# Patient Record
Sex: Male | Born: 1960 | Race: White | Hispanic: No | Marital: Single | Smoking: Never smoker
Health system: Southern US, Community
[De-identification: ages and names within clinical notes are randomized; demographics above are authoritative.]

## PROBLEM LIST (undated history)

## (undated) DIAGNOSIS — F419 Anxiety disorder, unspecified: Secondary | ICD-10-CM

## (undated) DIAGNOSIS — F1010 Alcohol abuse, uncomplicated: Secondary | ICD-10-CM

## (undated) DIAGNOSIS — E669 Obesity, unspecified: Secondary | ICD-10-CM

## (undated) HISTORY — DX: Anxiety disorder, unspecified: F41.9

## (undated) HISTORY — DX: Obesity, unspecified: E66.9

## (undated) HISTORY — DX: Alcohol abuse, uncomplicated: F10.10

---

## 2003-04-21 ENCOUNTER — Ambulatory Visit (HOSPITAL_COMMUNITY): Admission: RE | Admit: 2003-04-21 | Discharge: 2003-04-21 | Payer: Self-pay | Admitting: *Deleted

## 2006-10-15 ENCOUNTER — Ambulatory Visit (HOSPITAL_COMMUNITY): Admission: RE | Admit: 2006-10-15 | Discharge: 2006-10-15 | Payer: Self-pay | Admitting: Family Medicine

## 2009-08-17 ENCOUNTER — Encounter: Admission: RE | Admit: 2009-08-17 | Discharge: 2009-08-17 | Payer: Self-pay | Admitting: Family Medicine

## 2009-10-06 ENCOUNTER — Ambulatory Visit (HOSPITAL_COMMUNITY): Admission: RE | Admit: 2009-10-06 | Discharge: 2009-10-06 | Payer: Self-pay | Admitting: Family Medicine

## 2012-01-16 ENCOUNTER — Inpatient Hospital Stay: Payer: Self-pay | Admitting: Psychiatry

## 2012-01-16 LAB — URINALYSIS, COMPLETE
Bacteria: NONE SEEN
Bilirubin,UR: NEGATIVE
Ph: 5 (ref 4.5–8.0)
Protein: NEGATIVE
Squamous Epithelial: 1
WBC UR: 5 /HPF (ref 0–5)

## 2012-01-16 LAB — CBC
MCHC: 34.3 g/dL (ref 32.0–36.0)
Platelet: 283 10*3/uL (ref 150–440)

## 2012-01-16 LAB — DRUG SCREEN, URINE
Barbiturates, Ur Screen: NEGATIVE (ref ?–200)
Cannabinoid 50 Ng, Ur ~~LOC~~: NEGATIVE (ref ?–50)
Cocaine Metabolite,Ur ~~LOC~~: NEGATIVE (ref ?–300)
Methadone, Ur Screen: NEGATIVE (ref ?–300)
Opiate, Ur Screen: NEGATIVE (ref ?–300)
Phencyclidine (PCP) Ur S: NEGATIVE (ref ?–25)
Tricyclic, Ur Screen: NEGATIVE (ref ?–1000)

## 2012-01-16 LAB — COMPREHENSIVE METABOLIC PANEL
Albumin: 3.9 g/dL (ref 3.4–5.0)
BUN: 10 mg/dL (ref 7–18)
Calcium, Total: 9.1 mg/dL (ref 8.5–10.1)
Chloride: 99 mmol/L (ref 98–107)
Creatinine: 1.12 mg/dL (ref 0.60–1.30)
EGFR (African American): >60
Osmolality: 266 (ref 275–301)
Potassium: 4.2 mmol/L (ref 3.5–5.1)
SGPT (ALT): 47 U/L (ref 12–78)

## 2012-01-16 LAB — TSH: Thyroid Stimulating Horm: 3.35 u[IU]/mL

## 2012-01-16 LAB — ETHANOL: Ethanol: <3 mg/dL

## 2012-06-11 ENCOUNTER — Emergency Department: Payer: Self-pay

## 2012-06-11 LAB — CBC WITH DIFFERENTIAL/PLATELET
Basophil %: 0.7 %
Eosinophil #: 0.3 10*3/uL (ref 0.0–0.7)
Eosinophil %: 3.4 %
Lymphocyte %: 20.8 %
MCV: 89 fL (ref 80–100)
Monocyte #: 0.6 x10 3/mm (ref 0.2–1.0)
Monocyte %: 7.1 %
Neutrophil %: 68 %
WBC: 8.2 10*3/uL (ref 3.8–10.6)

## 2012-06-11 LAB — BASIC METABOLIC PANEL
BUN: 13 mg/dL (ref 7–18)
Calcium, Total: 9.4 mg/dL (ref 8.5–10.1)
Co2: 27 mmol/L (ref 21–32)
EGFR (African American): >60
EGFR (Non-African Amer.): >60
Potassium: 3.7 mmol/L (ref 3.5–5.1)

## 2012-06-17 LAB — CULTURE, BLOOD (SINGLE)

## 2013-02-13 ENCOUNTER — Emergency Department: Payer: Self-pay | Admitting: Emergency Medicine

## 2013-02-13 LAB — CBC WITH DIFFERENTIAL/PLATELET
BASOS ABS: 0.1 10*3/uL (ref 0.0–0.1)
Basophil %: 1.2 %
EOS ABS: 0.2 10*3/uL (ref 0.0–0.7)
Eosinophil %: 2.6 %
HCT: 42.4 % (ref 40.0–52.0)
HGB: 14 g/dL (ref 13.0–18.0)
LYMPHS PCT: 24 %
Lymphocyte #: 1.5 10*3/uL (ref 1.0–3.6)
MCH: 30.3 pg (ref 26.0–34.0)
MCHC: 32.9 g/dL (ref 32.0–36.0)
MCV: 92 fL (ref 80–100)
Monocyte #: 0.5 x10 3/mm (ref 0.2–1.0)
Monocyte %: 8.2 %
NEUTROS ABS: 4 10*3/uL (ref 1.4–6.5)
Neutrophil %: 64 %
PLATELETS: 258 10*3/uL (ref 150–440)
RBC: 4.6 10*6/uL (ref 4.40–5.90)
RDW: 13.4 % (ref 11.5–14.5)
WBC: 6.3 10*3/uL (ref 3.8–10.6)

## 2013-02-13 LAB — COMPREHENSIVE METABOLIC PANEL
ALBUMIN: 3.5 g/dL (ref 3.4–5.0)
ALK PHOS: 89 U/L
ALT: 29 U/L (ref 12–78)
ANION GAP: 1 — AB (ref 7–16)
AST: 26 U/L (ref 15–37)
BILIRUBIN TOTAL: 0.4 mg/dL (ref 0.2–1.0)
BUN: 8 mg/dL (ref 7–18)
CALCIUM: 8.8 mg/dL (ref 8.5–10.1)
CHLORIDE: 105 mmol/L (ref 98–107)
CREATININE: 1.05 mg/dL (ref 0.60–1.30)
Co2: 31 mmol/L (ref 21–32)
EGFR (African American): >60
EGFR (Non-African Amer.): >60
Glucose: 88 mg/dL (ref 65–99)
OSMOLALITY: 272 (ref 275–301)
Potassium: 4.1 mmol/L (ref 3.5–5.1)
Sodium: 137 mmol/L (ref 136–145)
TOTAL PROTEIN: 6.9 g/dL (ref 6.4–8.2)

## 2013-02-13 LAB — PRO B NATRIURETIC PEPTIDE: B-Type Natriuretic Peptide: 22 pg/mL (ref 0–125)

## 2013-02-13 LAB — TROPONIN I: Troponin-I: <0.02 ng/mL

## 2013-11-29 ENCOUNTER — Encounter: Payer: Self-pay | Admitting: *Deleted

## 2013-12-14 ENCOUNTER — Encounter: Payer: Self-pay | Admitting: Specialist

## 2014-01-01 ENCOUNTER — Encounter: Payer: Self-pay | Admitting: Specialist

## 2014-03-24 ENCOUNTER — Emergency Department: Payer: Self-pay | Admitting: Emergency Medicine

## 2014-04-08 ENCOUNTER — Emergency Department
Admit: 2014-04-08 | Disposition: A | Discharge status: Home / Self Care | Payer: Self-pay | Admitting: Emergency Medicine

## 2014-04-23 NOTE — H&P (Signed)
PATIENT NAME:  Nicholas Larsen, Nicholas Larsen MR#:  [id number] DATE OF BIRTH:  19-Oct-1960  DATE OF ADMISSION:  01/16/2012  IDENTIFYING INFORMATION: The patient is a 54 year old man who came to the Emergency Room voluntarily seeking detox from alcohol.   CHIEF COMPLAINT: "I'm an alcoholic."   HISTORY OF PRESENT ILLNESS: Information is obtained from the patient and the chart. He says that he has been drinking steadily for about the last 3 to 4 years. He claims that he drinks about 8 or 12 beers a day. He is motivated to stop drinking because his sisters have been holding it over him and suggesting that they would refuse him money from the family estate because of his drinking. Also, he lost his driver's license a couple of years ago and has not been able to work. He says that his mood has been somewhat anxious. At another point, he describes it as depressed. He denies suicidal ideation. He denies homicidal ideation. He denies hallucinations. He sleeps poorly, feels tired a lot during the day. He feels bad about himself, does not have very much that he enjoys in his life. He says that he does use clonazepam 3 mg a day, which is prescribed to him by his primary care doctor. He denies that he abuses it or abuses any other drugs.   PAST PSYCHIATRIC HISTORY: Only previous psychiatric admissions were for detox from alcohol. He denies history of suicide attempts. He denies any history of psychosis. He says he is not on any psychiatric medicine and has not been in the past.   SUBSTANCE ABUSE HISTORY: He says that he had managed to stay sober for about 20 years between 1990 and 2010 but then relapsed and has been drinking steadily ever since. He is familiar with 12-step programs and substance abuse treatment programs. He denies any history of seizures or delirium tremens. He denies abuse of other drugs.   SOCIAL HISTORY: Not married, no children. He lives by himself. 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 Closest relatives are his 2 sisters who, from what I can tell, basically hold the pursestrings over him, especially since unemployment has run out.   FAMILY HISTORY: None identified.   REVIEW OF SYSTEMS: He complains of feeling tired, run down, bad about himself and depressed. Denies suicidal ideation. Denies psychotic symptoms. Denies nausea and vomiting or any GI symptoms. He says he has chronic pain in his knees from knee surgery.   PAST MEDICAL HISTORY: Has had some knee surgery in the past. Otherwise, has no known ongoing medical problems.   CURRENT MEDICATIONS: Listed as only being clonazepam 1 mg 3 times a day,   ALLERGIES: No known drug allergies.   MENTAL STATUS EXAM: Overweight gentleman interviewed in the Emergency Room. Eye contact was good. Psychomotor activity normal. Cooperative with the exam. Speech normal in rate, tone, and volume. Affect somewhat flat and blunted. Mood stated as being depressed. Thoughts are generally lucid, a little bit slowed. Denies any hallucinations. No evidence of delusions or paranoia. Denies suicidal or homicidal ideation. Judgment and insight are reasonably intact. Short and long-term memory appeared grossly intact. Alert and oriented x 4.   PHYSICAL EXAMINATION: GENERAL: Obese gentleman, weight 340 pounds. He does not look to be in any acute distress.  SKIN: No skin lesions identified.  HEENT: Pupils are equal and reactive. Face symmetric. Oral mucosa normal. Nontender neck and back.  MUSCULOSKELETAL: Full range of motion at  all extremities. Gait is a little bit wide-based and slow. Strength is symmetric, as are reflexes throughout.  NEUROLOGICAL: Cranial nerves symmetric and normal.  LUNGS: Clear with no wheezes.  HEART: Regular rate and rhythm.  ABDOMEN: Obese, soft, nontender, normal bowel sounds.  VITAL SIGNS: Temperature last read as 98.8, pulse 93, respirations 20, blood pressure 174/93.   LABORATORY  RESULTS: Drug screen positive for benzodiazepines. TSH normal at 3.3. Alcohol undetected. Bilirubin elevated at 1.1. Sodium slightly low at 133. No other significant laboratory abnormalities. CBC normal.   ASSESSMENT: A 54 year old man with alcohol dependence, seeks detox. No history of seizures or DTs. Has not been able to quit drinking on his own. Gets very shaky when he does. Mood is a little bit run down. Feeling somewhat hopeless about himself, limited social resources.   TREATMENT PLAN: Admit to psychiatry. Regular detox protocol orders. Engage in daily groups and activities for psychoeducation and supportive therapy about substance abuse. Work with him on encouraging possible referral [facility].   DIAGNOSIS, PRINCIPAL AND PRIMARY:  AXIS I: Alcohol dependence.   SECONDARY DIAGNOSES: AXIS I: Depression, not otherwise specified.   AXIS II: Deferred.   AXIS III:  1. Obesity. 2. Chronic knee pain.   AXIS IV:   Moderate, ongoing stress from lack of resources.   AXIS V: Functioning at time of evaluation 35.   ____________________________ Audery Amel, MD jtc:cb D: 01/16/2012 18:06:04 ET T: 01/16/2012 18:16:24 ET JOB#: [id number]  cc: Audery Amel, MD, <Dictator> Audery Amel MD ELECTRONICALLY SIGNED 01/16/2012 23:26

## 2014-04-23 NOTE — Consult Note (Signed)
Brief Consult Note: Diagnosis: alcohol dep.   Patient was seen by consultant.   Consult note dictated.   Recommend further assessment or treatment.   Orders entered.   Discussed with Attending MD.   Comments: Psychiatry: Patient seen. Admit for alcohol detox.  Electronic Signatures: Audery Amellapacs, John T (MD)  (Signed 15-Jan-14 17:58)  Authored: Brief Consult Note   Last Updated: 15-Jan-14 17:58 by Audery Amellapacs, John T (MD)

## 2014-04-23 NOTE — Discharge Summary (Signed)
PATIENT NAME:  Nicholas Larsen, Momin J MR#:  [id number] DATE OF BIRTH:  [birth year]  DATE OF ADMISSION:  01/16/2012  DATE OF DISCHARGE:  01/23/2012  HOSPITAL COURSE: See dictated history and physical for details of admission. This 54 year old gentleman with a history of alcohol dependence was admitted through the Emergency Room with complaints of depression, low mood, passive suicidal ideation related to relapse into substance abuse. He was admitted to the hospital for alcohol withdrawal treatment. The patient was very focused on wanting to go to longer-term rehab treatment as well. During his hospital stay, he was tapered off of benzodiazepines without difficulty. He attended groups and participated appropriately. He showed good judgment and insight. Did not report any suicidal ideation and was positively focused on wanting to stay sober. A bed was obtained at the [facility], and he was discharged for transfer voluntarily to that facility on the 22nd. Medically, he was stable. He was not requiring any psychiatric medication at discharge.   DISCHARGE MEDICATIONS: Claritin 10 mg p.o. daily, hydrochlorothiazide 25 mg p.o. daily.   LABORATORY RESULTS: Admission lab studies on January 15, showed a drug screen positive for benzodiazepines. TSH normal at 3.35. Alcohol nondetected. Chemistry panel with a slightly low sodium at 133, glucose slightly elevated at 115, bilirubin 1.1. Hematology panel normal. Urinalysis unremarkable.   MENTAL STATUS EXAM AT DISCHARGE: Neatly dressed and groomed man who looks his stated age. Cooperative with the interview. Good eye contact, normal psychomotor activity. Speech normal in rate, tone and volume. Affect euthymic, reactive, appropriate. Mood stated as good. Thoughts are lucid without loosening of associations or delusions. Denies auditory or visual hallucinations. Denies suicidal or homicidal ideation. Shows good insight and judgment. Normal  intelligence. Appropriate short and long term memory intact.   DISPOSITION: Discharge voluntarily for transfer to the alcohol and drug abuse treatment center.   DIAGNOSIS PRINCIPLE AND PRIMARY:  AXIS I: Alcohol dependence.   SECONDARY DIAGNOSES: AXIS I: Substance-induced mood disorder.  AXIS II: Deferred.  AXIS III: Hypertension, seasonal allergies.  AXIS IV: Moderate stress from financial strain.  AXIS V: Functioning at time of discharge 60.   ____________________________ Audery AmelJohn T. Kinser Fellman, MD jtc:dm D: 02/03/2012 18:38:23 ET T: 02/04/2012 09:31:29 ET JOB#: [id number]  cc: Audery AmelJohn T. Hien Cunliffe, MD, <Dictator> Audery AmelJOHN T Saphyre Cillo MD ELECTRONICALLY SIGNED 02/04/2012 18:50

## 2014-06-04 ENCOUNTER — Other Ambulatory Visit (HOSPITAL_COMMUNITY): Payer: Self-pay | Admitting: *Deleted

## 2014-06-04 ENCOUNTER — Ambulatory Visit: Payer: Self-pay | Admitting: Physical Therapy

## 2014-06-04 ENCOUNTER — Ambulatory Visit (HOSPITAL_COMMUNITY): Payer: Medicaid Other | Attending: Internal Medicine

## 2014-06-04 DIAGNOSIS — M79605 Pain in left leg: Secondary | ICD-10-CM

## 2014-06-08 ENCOUNTER — Emergency Department
Admission: EM | Admit: 2014-06-08 | Discharge: 2014-06-08 | Disposition: A | Discharge status: Home / Self Care | Payer: No Typology Code available for payment source | Attending: Emergency Medicine | Admitting: Emergency Medicine

## 2014-06-08 ENCOUNTER — Encounter: Payer: Self-pay | Admitting: Emergency Medicine

## 2014-06-08 ENCOUNTER — Emergency Department: Payer: No Typology Code available for payment source

## 2014-06-08 DIAGNOSIS — S161XXA Strain of muscle, fascia and tendon at neck level, initial encounter: Secondary | ICD-10-CM | POA: Diagnosis not present

## 2014-06-08 DIAGNOSIS — S8001XA Contusion of right knee, initial encounter: Secondary | ICD-10-CM | POA: Insufficient documentation

## 2014-06-08 DIAGNOSIS — Z79899 Other long term (current) drug therapy: Secondary | ICD-10-CM | POA: Diagnosis not present

## 2014-06-08 DIAGNOSIS — Y92410 Unspecified street and highway as the place of occurrence of the external cause: Secondary | ICD-10-CM | POA: Insufficient documentation

## 2014-06-08 DIAGNOSIS — Y998 Other external cause status: Secondary | ICD-10-CM | POA: Diagnosis not present

## 2014-06-08 DIAGNOSIS — Y9389 Activity, other specified: Secondary | ICD-10-CM | POA: Insufficient documentation

## 2014-06-08 DIAGNOSIS — S199XXA Unspecified injury of neck, initial encounter: Secondary | ICD-10-CM | POA: Diagnosis present

## 2014-06-08 MED ORDER — KETOROLAC TROMETHAMINE 10 MG PO TABS: 10.0000 mg | ORAL_TABLET | Freq: Three times a day (TID) | ORAL | Status: AC

## 2014-06-08 MED ORDER — ORPHENADRINE CITRATE 30 MG/ML IJ SOLN
60.0000 mg | INTRAMUSCULAR | Status: AC
  Administered 2014-06-08: 60 mg via INTRAMUSCULAR

## 2014-06-08 MED ORDER — KETOROLAC TROMETHAMINE 60 MG/2ML IM SOLN
INTRAMUSCULAR | Status: AC
  Filled 2014-06-08: qty 2

## 2014-06-08 MED ORDER — KETOROLAC TROMETHAMINE 60 MG/2ML IM SOLN
60.0000 mg | Freq: Once | INTRAMUSCULAR | Status: AC
  Administered 2014-06-08: 60 mg via INTRAMUSCULAR

## 2014-06-08 MED ORDER — ORPHENADRINE CITRATE ER 100 MG PO TB12: 100.0000 mg | ORAL_TABLET | Freq: Two times a day (BID) | ORAL | Status: AC

## 2014-06-08 MED ORDER — ORPHENADRINE CITRATE 30 MG/ML IJ SOLN
INTRAMUSCULAR | Status: AC
  Filled 2014-06-08: qty 2
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
## 2014-08-09 LAB — BASIC METABOLIC PANEL
ANION GAP: 8 (ref 7–16)
BUN: 12 mg/dL
CALCIUM: 9.4 mg/dL
CHLORIDE: 99 mmol/L — AB
CO2: 30 mmol/L
CREATININE: 1.13 mg/dL
EGFR (African American): >60
Glucose: 102 mg/dL — ABNORMAL HIGH
Potassium: 4 mmol/L
Sodium: 137 mmol/L

## 2014-08-09 LAB — CBC
HCT: 43.8 % (ref 40.0–52.0)
HGB: 14.1 g/dL (ref 13.0–18.0)
MCH: 29.2 pg (ref 26.0–34.0)
MCHC: 32.1 g/dL (ref 32.0–36.0)
MCV: 91 fL (ref 80–100)
PLATELETS: 310 10*3/uL (ref 150–440)
RBC: 4.82 10*6/uL (ref 4.40–5.90)
RDW: 13.3 % (ref 11.5–14.5)
WBC: 9.5 10*3/uL (ref 3.8–10.6)

## 2014-08-09 LAB — URIC ACID: URIC ACID: 8.6 mg/dL — AB

## 2014-09-24 ENCOUNTER — Emergency Department
Admission: EM | Admit: 2014-09-24 | Discharge: 2014-09-24 | Disposition: A | Discharge status: Home / Self Care | Payer: Medicaid Other | Attending: Emergency Medicine | Admitting: Emergency Medicine

## 2014-09-24 ENCOUNTER — Encounter: Payer: Self-pay | Admitting: Emergency Medicine

## 2014-09-24 ENCOUNTER — Emergency Department: Payer: Medicaid Other

## 2014-09-24 DIAGNOSIS — M25519 Pain in unspecified shoulder: Secondary | ICD-10-CM | POA: Insufficient documentation

## 2014-09-24 DIAGNOSIS — E669 Obesity, unspecified: Secondary | ICD-10-CM | POA: Diagnosis not present

## 2014-09-24 DIAGNOSIS — M7918 Myalgia, other site: Secondary | ICD-10-CM

## 2014-09-24 DIAGNOSIS — Y998 Other external cause status: Secondary | ICD-10-CM | POA: Diagnosis not present

## 2014-09-24 DIAGNOSIS — M25569 Pain in unspecified knee: Secondary | ICD-10-CM | POA: Insufficient documentation

## 2014-09-24 DIAGNOSIS — Z791 Long term (current) use of non-steroidal anti-inflammatories (NSAID): Secondary | ICD-10-CM | POA: Insufficient documentation

## 2014-09-24 DIAGNOSIS — Z79899 Other long term (current) drug therapy: Secondary | ICD-10-CM | POA: Insufficient documentation

## 2014-09-24 DIAGNOSIS — T148 Other injury of unspecified body region: Secondary | ICD-10-CM | POA: Insufficient documentation

## 2014-09-24 DIAGNOSIS — W010XXA Fall on same level from slipping, tripping and stumbling without subsequent striking against object, initial encounter: Secondary | ICD-10-CM | POA: Insufficient documentation

## 2014-09-24 DIAGNOSIS — Y9259 Other trade areas as the place of occurrence of the external cause: Secondary | ICD-10-CM | POA: Insufficient documentation

## 2014-09-24 DIAGNOSIS — R0602 Shortness of breath: Secondary | ICD-10-CM | POA: Diagnosis not present

## 2014-09-24 DIAGNOSIS — G8929 Other chronic pain: Secondary | ICD-10-CM | POA: Insufficient documentation

## 2014-09-24 DIAGNOSIS — Y9389 Activity, other specified: Secondary | ICD-10-CM | POA: Insufficient documentation

## 2014-09-24 DIAGNOSIS — S299XXA Unspecified injury of thorax, initial encounter: Secondary | ICD-10-CM | POA: Diagnosis present

## 2014-09-24 MED ORDER — MELOXICAM 15 MG PO TABS: 15.0000 mg | ORAL_TABLET | Freq: Every day | ORAL | Status: AC

## 2014-09-24 NOTE — ED Provider Notes (Signed)
[facility] Emergency Department Provider Note ____________________________________________  Time seen: Approximately 10:58 AM  I have reviewed the triage vital signs and the nursing notes.   HISTORY  Chief Complaint Fall    HPI Nicholas Larsen is a 54 y.o. male presenting with right and left side pain following a fall one week ago while at a resort in [place]. Patient states that he slipped on a marble floor and landed on his right side. Then when he tried to stand up, he slipped again and does not know which side he landed on. He did not hit his head and did not have LOC. His right side became bruised and swollen following the fall and has continued to be painful. Pain is located on both the right and left side by the ribs just below the nipple line. Pain is described as 8/10 sharp pain, worsened with deep breaths and certain arm movements. Patient states he becomes SOB with the pain. Patient had Flexeril with him on his trip, which did not relieve the pain. The swelling and bruising has since resolved, but the pain remains. Patient denies fever, chills, nausea, vomiting, diarrhea, and constipation. Patient has history of chronic joint pain including to shoulders and knees. He has an appointment with orthopedics on Monday.    Past Medical History  Diagnosis Date  . Obesity   . Anxiety   . Alcohol abuse     There are no active problems to display for this patient.   History reviewed. No pertinent past surgical history.  Current Outpatient Rx  Name  Route  Sig  Dispense  Refill  . clonazePAM (KLONOPIN) 1 MG tablet   Oral   Take 2 mg by mouth 2 (two) times daily.          . cyclobenzaprine (FLEXERIL) 10 MG tablet   Oral   Take 10 mg by mouth 3 (three) times daily as needed for muscle spasms.         . furosemide (LASIX) 40 MG tablet   Oral   Take 40 mg by mouth.         Marland Kitchen ketorolac (TORADOL) 10 MG tablet   Oral   Take 1 tablet (10 mg total) by  mouth every 8 (eight) hours.   15 tablet   0   . meloxicam (MOBIC) 15 MG tablet   Oral   Take 1 tablet (15 mg total) by mouth daily.   30 tablet   2   . metoprolol succinate (TOPROL-XL) 50 MG 24 hr tablet   Oral   Take 50 mg by mouth daily. Take with or immediately following a meal.         . orphenadrine (NORFLEX) 100 MG tablet   Oral   Take 1 tablet (100 mg total) by mouth 2 (two) times daily.   20 tablet   0   . potassium chloride (K-DUR,KLOR-CON) 10 MEQ tablet   Oral   Take 10 mEq by mouth 2 (two) times daily.         . traMADol (ULTRAM) 50 MG tablet   Oral   Take 100 mg by mouth every 6 (six) hours as needed.           Allergies Review of patient's allergies indicates no known allergies.  Family History  Problem Relation Age of Onset  . Family history unknown: Yes    Social History Social History  Substance Use Topics  . Smoking status: Never Smoker   .  Smokeless tobacco: None  . Alcohol Use: Yes    Review of Systems Constitutional: No fever/chills Eyes: No visual changes. ENT: No sore throat. Cardiovascular: Denies chest pain. Respiratory: Positive for shortness of breath, especially with deep breaths. Gastrointestinal: No abdominal pain.  No nausea, no vomiting.  No diarrhea.  No constipation. Genitourinary: Negative for dysuria. Musculoskeletal: Positive for chronic back pain and knee pain. Skin: Negative for rash. Neurological: Negative for headaches, focal weakness or numbness. Hematological/Lymphatic:Positive for resolved bruising on the right side at rib level following fall  Allergic/Immunilogical: no known allergies 10-point ROS otherwise negative.  ____________________________________________   PHYSICAL EXAM:  VITAL SIGNS: ED Triage Vitals  Enc Vitals Group     BP 09/24/14 0952 137/68 mmHg     Pulse Rate 09/24/14 0952 95     Resp 09/24/14 0952 20     Temp 09/24/14 0952 97.6 F (36.4 C)     Temp Source 09/24/14 0952 Oral      SpO2 09/24/14 0952 98 %     Weight 09/24/14 0952 340 lb (154.223 kg)     Height 09/24/14 0952  (1.88 m)     Head Cir --      Peak Flow --      Pain Score 09/24/14 0953 9     Pain Loc --      Pain Edu? --      Excl. in GC? --     Constitutional: Alert and oriented. Well appearing and in no acute distress. Eyes: Conjunctivae are normal. PERRL. EOMI. Head: Atraumatic. Nose: No congestion/rhinnorhea. Mouth/Throat: Mucous membranes are moist.  Oropharynx non-erythematous. Neck: No stridor.   Hematological/Lymphatic/Immunilogical: No cervical lymphadenopathy.  Cardiovascular: Normal rate, regular rhythm. Grossly normal heart sounds.  Good peripheral circulation. Respiratory: Normal respiratory effort.  No retractions. Lungs CTAB. Patient winces with deep breaths. Gastrointestinal: Obese. Soft and nontender. No distention. No abdominal bruits. No CVA tenderness. Musculoskeletal: Point tenderness to right and left side at about level of rib 6-7. No bruising or erythema noted. Full ROM and 5/5 strength in upper extremities. Pain in sides worsened with shoulder adduction.  Neurologic:  Normal speech and language. No gross focal neurologic deficits are appreciated. No gait instability. Skin:  Skin is warm, dry and intact. No rash noted. No bruising or erythema.  Psychiatric: Mood and affect are normal. Speech and behavior are normal.  ____________________________________________   LABS (all labs ordered are listed, but only abnormal results are displayed)  Labs Reviewed - No data to display ____________________________________________  EKG  None ____________________________________________  RADIOLOGY DG Chest 2 View FINDINGS: The lungs are clear. Heart size is upper normal. No pneumothorax or pleural effusion. No bony abnormality is identified. IMPRESSION: No acute abnormality. ____________________________________________   PROCEDURES  Procedure(s) performed:  None  Critical Care performed: No  ____________________________________________   INITIAL IMPRESSION / ASSESSMENT AND PLAN / ED COURSE  Pertinent labs & imaging results that were available during my care of the patient were reviewed by me and considered in my medical decision making (see chart for details).  Patient was advised to keep the appointment with orthopedics and advised them of his right side pain. He was advised to return to the emergency department for symptoms that change or worsen if he is unable to get an earlier appointment. ____________________________________________   FINAL CLINICAL IMPRESSION(S) / ED DIAGNOSES  Final diagnoses:  Musculoskeletal pain     Chinita Pester, FNP 09/24/14 1339  Darien Ramus, MD 09/24/14 1544

## 2014-09-24 NOTE — Discharge Instructions (Signed)

## 2014-09-24 NOTE — ED Notes (Signed)
Reports fell a week ago on a cruise, pain in right ribs since.  No resp distress

## 2015-08-08 ENCOUNTER — Emergency Department
Admission: EM | Admit: 2015-08-08 | Discharge: 2015-08-08 | Disposition: A | Discharge status: Home / Self Care | Payer: Medicare HMO | Attending: Emergency Medicine | Admitting: Emergency Medicine

## 2015-08-08 ENCOUNTER — Encounter: Payer: Self-pay | Admitting: Emergency Medicine

## 2015-08-08 ENCOUNTER — Emergency Department: Payer: Medicare HMO

## 2015-08-08 DIAGNOSIS — S42401A Unspecified fracture of lower end of right humerus, initial encounter for closed fracture: Secondary | ICD-10-CM | POA: Diagnosis not present

## 2015-08-08 DIAGNOSIS — S0990XA Unspecified injury of head, initial encounter: Secondary | ICD-10-CM

## 2015-08-08 DIAGNOSIS — W108XXA Fall (on) (from) other stairs and steps, initial encounter: Secondary | ICD-10-CM | POA: Insufficient documentation

## 2015-08-08 DIAGNOSIS — Y92410 Unspecified street and highway as the place of occurrence of the external cause: Secondary | ICD-10-CM | POA: Insufficient documentation

## 2015-08-08 DIAGNOSIS — S022XXA Fracture of nasal bones, initial encounter for closed fracture: Secondary | ICD-10-CM | POA: Diagnosis not present

## 2015-08-08 DIAGNOSIS — S59901A Unspecified injury of right elbow, initial encounter: Secondary | ICD-10-CM | POA: Diagnosis present

## 2015-08-08 DIAGNOSIS — Y939 Activity, unspecified: Secondary | ICD-10-CM | POA: Diagnosis not present

## 2015-08-08 DIAGNOSIS — Y999 Unspecified external cause status: Secondary | ICD-10-CM | POA: Insufficient documentation

## 2015-08-08 DIAGNOSIS — T1490XA Injury, unspecified, initial encounter: Secondary | ICD-10-CM

## 2015-08-08 DIAGNOSIS — S022XXB Fracture of nasal bones, initial encounter for open fracture: Secondary | ICD-10-CM

## 2015-08-08 MED ORDER — HYDROMORPHONE HCL 1 MG/ML IJ SOLN
1.0000 mg | Freq: Once | INTRAMUSCULAR | Status: AC
  Administered 2015-08-08: 1 mg via INTRAVENOUS
  Filled 2015-08-08: qty 1

## 2015-08-08 MED ORDER — ONDANSETRON HCL 4 MG/2ML IJ SOLN
4.0000 mg | Freq: Once | INTRAMUSCULAR | Status: AC
Start: 2015-08-08 — End: 2015-08-08
  Administered 2015-08-08: 4 mg via INTRAVENOUS
  Filled 2015-08-08: qty 2

## 2015-08-08 MED ORDER — CEPHALEXIN 500 MG PO CAPS: 500.0000 mg | ORAL_CAPSULE | Freq: Four times a day (QID) | ORAL | 0 refills | Status: AC

## 2015-08-08 MED ORDER — TRAMADOL HCL ER 100 MG PO TB24: 100.0000 mg | ORAL_TABLET | Freq: Every day | ORAL | 0 refills | Status: AC

## 2015-08-08 NOTE — Discharge Instructions (Signed)
Please apply ice to the areas of swelling including the nose and the elbow region. The splint is temporary and no need to follow up with [place]. Please call them later this morning to establish outpatient follow-up. Please call the ENT physician concerning your nasal fracture. The most likely will see you after the swelling is decreased. Please return immediately if condition worsens. Please contact her primary physician or the physician you were given for referral. If you have any specialist physicians involved in her treatment and plan please also contact them. Thank you for using  regional emergency Department.

## 2015-08-08 NOTE — ED Notes (Signed)
Cleaned pt's nose with sterile saline, and applied SteriStrips per MD Huel CoteQuigley order. Techs to bedside to apply splint/sling

## 2015-08-08 NOTE — ED Notes (Signed)
Reviewed d/c instructions, follow-up care, prescriptions, use of ice/elevation with pt and pt's spouse. PT and spouse verbalized [place]

## 2015-08-08 NOTE — ED Triage Notes (Signed)
Pt arrived by EMS from home, post fall. EMS reports pt had consumed several alcoholic beverages, went outside, fell face first onto wooden steps. Upon arrival pt has deformity to right arm and bloody swollen nose. Pt is A&O x4 with slurred speech, poor safety awareness. Fall risk applied.

## 2015-08-08 NOTE — ED Provider Notes (Signed)
Time Seen: Approximately *136  I have reviewed the triage notes  Chief Complaint: Fall   History of Present Illness: Nicholas Larsen is a 55 y.o. male presents via EMS after he had a large amount of alcoholic beverages earlier this evening. Patient apparently went outside and fell face first on some wooden steps. Mainly concerned about an injury to his nose and also to his right elbow. She was transported here by EMS uneventfully. He denies any loss of consciousness. He denies any neck, thoracic, lumbar spine pain   Past Medical History:  Diagnosis Date  . Alcohol abuse   . Anxiety   . Obesity     There are no active problems to display for this patient.   History reviewed. No pertinent surgical history.  History reviewed. No pertinent surgical history.  Current Outpatient Rx  . Order #: [id number] Class: Historical Med  . Order #: [id number] Class: Historical Med  . Order #: [id number] Class: Historical Med  . Order #: [id number] Class: Print  . Order #: [id number] Class: Print  . Order #: [id number] Class: Historical Med  . Order #: [id number] Class: Print  . Order #: [id number] Class: Historical Med  . Order #: [id number] Class: Historical Med    Allergies:  Review of patient's allergies indicates no known allergies.  Family History: Family History  Problem Relation Age of Onset  . Family history unknown: Yes    Social History: Social History  Substance Use Topics  . Smoking status: Never Smoker  . Smokeless tobacco: Never Used  . Alcohol use Yes     Review of Systems:   10 point review of systems was performed and was otherwise negative:  Constitutional: No fever Eyes: No visual disturbances ENT: No sore throat, ear pain Cardiac: No chest pain Respiratory: No shortness of breath, wheezing, or stridor Abdomen: No abdominal pain, no vomiting, No diarrhea Endocrine: No weight loss, No night sweats Extremities: No peripheral edema, cyanosis Skin: No rashes, easy  bruising Neurologic: No focal weakness, trouble with speech or swollowing Urologic: No dysuria, Hematuria, or urinary frequency   Physical Exam:  ED Triage Vitals  Enc Vitals Group     BP --      Pulse Rate 08/08/15 0136 (!) 106     Resp --      Temp 08/08/15 0136 98 F (36.7 C)     Temp Source 08/08/15 0136 Oral     SpO2 08/08/15 0134 95 %     Weight 08/08/15 0136 (!) 343 lb 0.6 oz (155.6 kg)     Height 08/08/15 0136 6\' 2"  (1.88 m)     Head Circumference --      Peak Flow --      Pain Score 08/08/15 0135 10     Pain Loc --      Pain Edu? --      Excl. in GC? --     General: Awake , Alert , and Oriented times 3; GCS 15 Smells of alcohol Head: Normal cephalic , obvious deformity to the bridge of the nose with a 1 cm vertical laceration. Eyes: Pupils equal , round, reactive to light Nose/Throat: No nasal drainage, patent upper airway without erythema or exudate.  Neck: Supple, Full range of motion, No anterior adenopathy or palpable thyroid masses Lungs: Clear to ascultation without wheezes , rhonchi, or rales Heart: Regular rate, regular rhythm without murmurs , gallops , or rubs Abdomen: Soft, non tender without rebound, guarding , or rigidity; bowel sounds positive and symmetric in all 4 quadrants.  No organomegaly .        Extremities: Pain mainly over the lateral surface of the right elbow without any crepitus or step-off noted. No obvious shoulder or wrist deformity  Neurologic: normal ambulation, Motor symmetric without deficits, sensory intact Skin: warm, dry, no rashes   Radiology:   CLINICAL DATA:  Status post fall, with right elbow pain. Initial encounter.  EXAM: RIGHT ELBOW - COMPLETE 3+ VIEW  COMPARISON:  None.  FINDINGS: There is widening of the elbow joint space, particularly at the articulation between the distal humerus and olecranon, with suggestion of underlying tiny osseous fragments. This may reflect acute subluxation or partial dislocation.  Would correlate with the patient's symptoms, and consider MRI or CT for further evaluation as deemed clinically appropriate.  IMPRESSION: Widening of the elbow joint space, particularly at the articulation between the distal humerus and olecranon, with suggestion of underlying tiny osseous fragments. This may reflect acute subluxation or partial dislocation. Would correlate with the patient's symptoms, and consider MRI or CT for further evaluation as deemed clinically appropriate.   Electronically Signed   By: Roanna Raider M.D.   On: 08/08/2015 03:09   EXAM: CT HEAD WITHOUT CONTRAST  CT MAXILLOFACIAL WITHOUT CONTRAST  TECHNIQUE: Multidetector CT imaging of the head and maxillofacial structures were performed using the standard protocol without intravenous contrast. Multiplanar CT image reconstructions of the maxillofacial structures were also generated.  COMPARISON:  None.  FINDINGS: CT HEAD FINDINGS  Mild generalized atrophy, however advanced for age. Mild chronic small vessel ischemia.No intracranial hemorrhage, mass effect, or midline shift. No hydrocephalus. The basilar cisterns are patent. No evidence of territorial infarct. No intracranial fluid collection. Calvarium is intact. The mastoid air cells are well aerated.  CT MAXILLOFACIAL FINDINGS  Mildly displaced bilateral nasal bone fractures. Nasal septum appears intact. Associated soft tissue edema. The orbits and globes are intact. The mandibles, zygomatic arches and pterygoid plates are intact. Paranasal sinuses are well-aerated without fluid level. Poor dentition with multiple missing teeth and dental caries. No radiopaque foreign body.  IMPRESSION: 1. No acute intracranial abnormality. Mild atrophy, however age advanced. 2. Mildly displaced bilateral nasal bone fractures.   Electronically Signed   By: Rubye Oaks M.D.   On: 08/08/2015 03:20    I personally reviewed the  radiologic studies   Procedures:  Patient had posterior splint applied to the right upper extremity with an arm sling. Splint was applied by the nurse technician and checked by myself.  He will receive a Steri-Strip across the bridge of his nose for the laceration.  ED Course: *  Patient's stay here was uneventful repeat exam shows that the elbow appears to be in the joint and I felt he did not have a true dislocation though there is obvious injury to the joint. I suspect he had an elbow dislocation that realigned itself. Patient states he has a orthopedic surgeon in [place] and I felt we would splint for comfort and referred him on an outpatient basis.  For his nasal fracture will be referred locally to your nose and throat. He will receive Keflex and pain medication. He was advised to have the nose is reexamined after the swelling is decreased and apply ice.   Clinical Course     Assessment:  Right elbow fracture Nasal fracture Laceration 1 cm bridge of the nose was Steri-Stripped    Plan:  Outpatient Keflex, Norco Splint Patient was advised to return immediately if condition worsens. Patient was advised to follow up with  their primary care physician or other specialized physicians involved in their outpatient care. The patient and/or family member/power of attorney had laboratory results reviewed at the bedside. All questions and concerns were addressed and appropriate discharge instructions were distributed by the nursing staff.             Jennye MoccasinBrian S Quigley, MD 08/08/15 [phone]

## 2015-12-15 ENCOUNTER — Other Ambulatory Visit: Payer: Self-pay | Admitting: Internal Medicine

## 2015-12-15 DIAGNOSIS — R079 Chest pain, unspecified: Secondary | ICD-10-CM

## 2015-12-15 DIAGNOSIS — R0602 Shortness of breath: Secondary | ICD-10-CM

## 2015-12-22 ENCOUNTER — Encounter
Admission: RE | Admit: 2015-12-22 | Discharge: 2015-12-22 | Disposition: A | Discharge status: Home / Self Care | Payer: Medicare HMO | Source: Ambulatory Visit | Attending: Internal Medicine | Admitting: Internal Medicine

## 2015-12-22 DIAGNOSIS — R0602 Shortness of breath: Secondary | ICD-10-CM | POA: Diagnosis present

## 2015-12-22 DIAGNOSIS — R079 Chest pain, unspecified: Secondary | ICD-10-CM | POA: Diagnosis not present

## 2015-12-22 MED ORDER — REGADENOSON 0.4 MG/5ML IV SOLN
0.4000 mg | Freq: Once | INTRAVENOUS | Status: AC
  Administered 2015-12-22: 0.4 mg via INTRAVENOUS
  Filled 2015-12-22: qty 5

## 2015-12-22 MED ORDER — TECHNETIUM TC 99M TETROFOSMIN IV KIT
30.1180 | PACK | Freq: Once | INTRAVENOUS | Status: AC | PRN
  Administered 2015-12-22: 30.118 via INTRAVENOUS

## 2015-12-23 ENCOUNTER — Encounter
Admission: RE | Admit: 2015-12-23 | Discharge: 2015-12-23 | Disposition: A | Discharge status: Home / Self Care | Payer: Medicare HMO | Source: Ambulatory Visit | Attending: Internal Medicine | Admitting: Internal Medicine

## 2015-12-23 DIAGNOSIS — R079 Chest pain, unspecified: Secondary | ICD-10-CM | POA: Diagnosis not present

## 2015-12-23 LAB — NM MYOCAR MULTI W/SPECT W/WALL MOTION / EF
CHL CUP NUCLEAR SDS: 13
CHL CUP NUCLEAR SRS: 0
CSEPEDS: 0 s
CSEPEW: 1 METS
Exercise duration (min): 1 min
LVDIAVOL: 93 mL (ref 62–150)
LVSYSVOL: 27 mL
MPHR: 165 {beats}/min
Peak HR: 104 {beats}/min
Percent HR: 63 %
Rest HR: 88 {beats}/min
SSS: 8
TID: 1

## 2015-12-23 MED ORDER — TECHNETIUM TC 99M TETROFOSMIN IV KIT
32.8300 | PACK | Freq: Once | INTRAVENOUS | Status: AC | PRN
  Administered 2015-12-23: 32.83 via INTRAVENOUS

## 2016-11-01 DEATH — deceased

## 2017-09-20 IMAGING — CR DG ELBOW COMPLETE 3+V*R*
4 series · 4 of 4 positions shown · non-contrast
Comparison: None.

CLINICAL DATA: Status post fall, with right elbow pain. Initial
encounter.

EXAM:
RIGHT ELBOW - COMPLETE 3+ VIEW

[elbow ap]
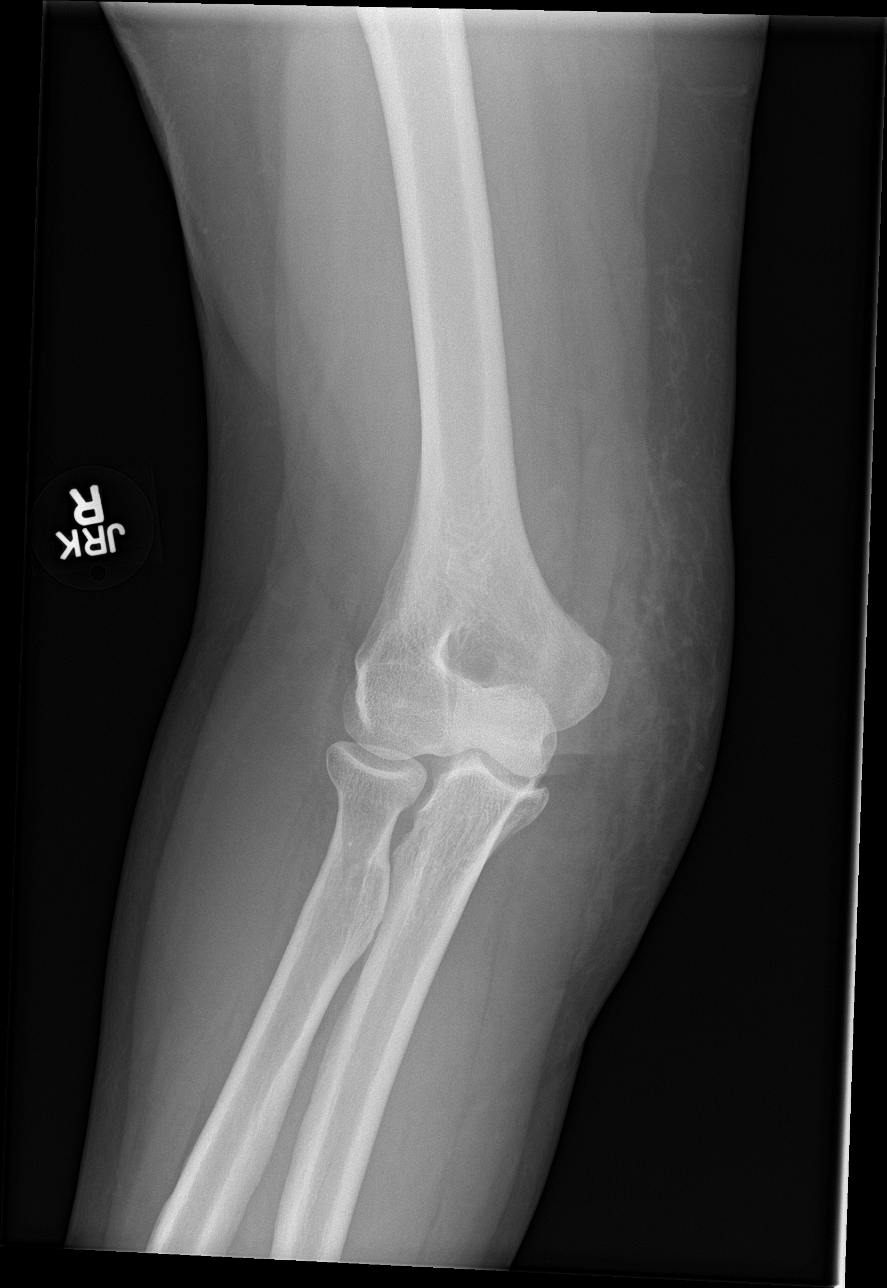

[elbow lat]
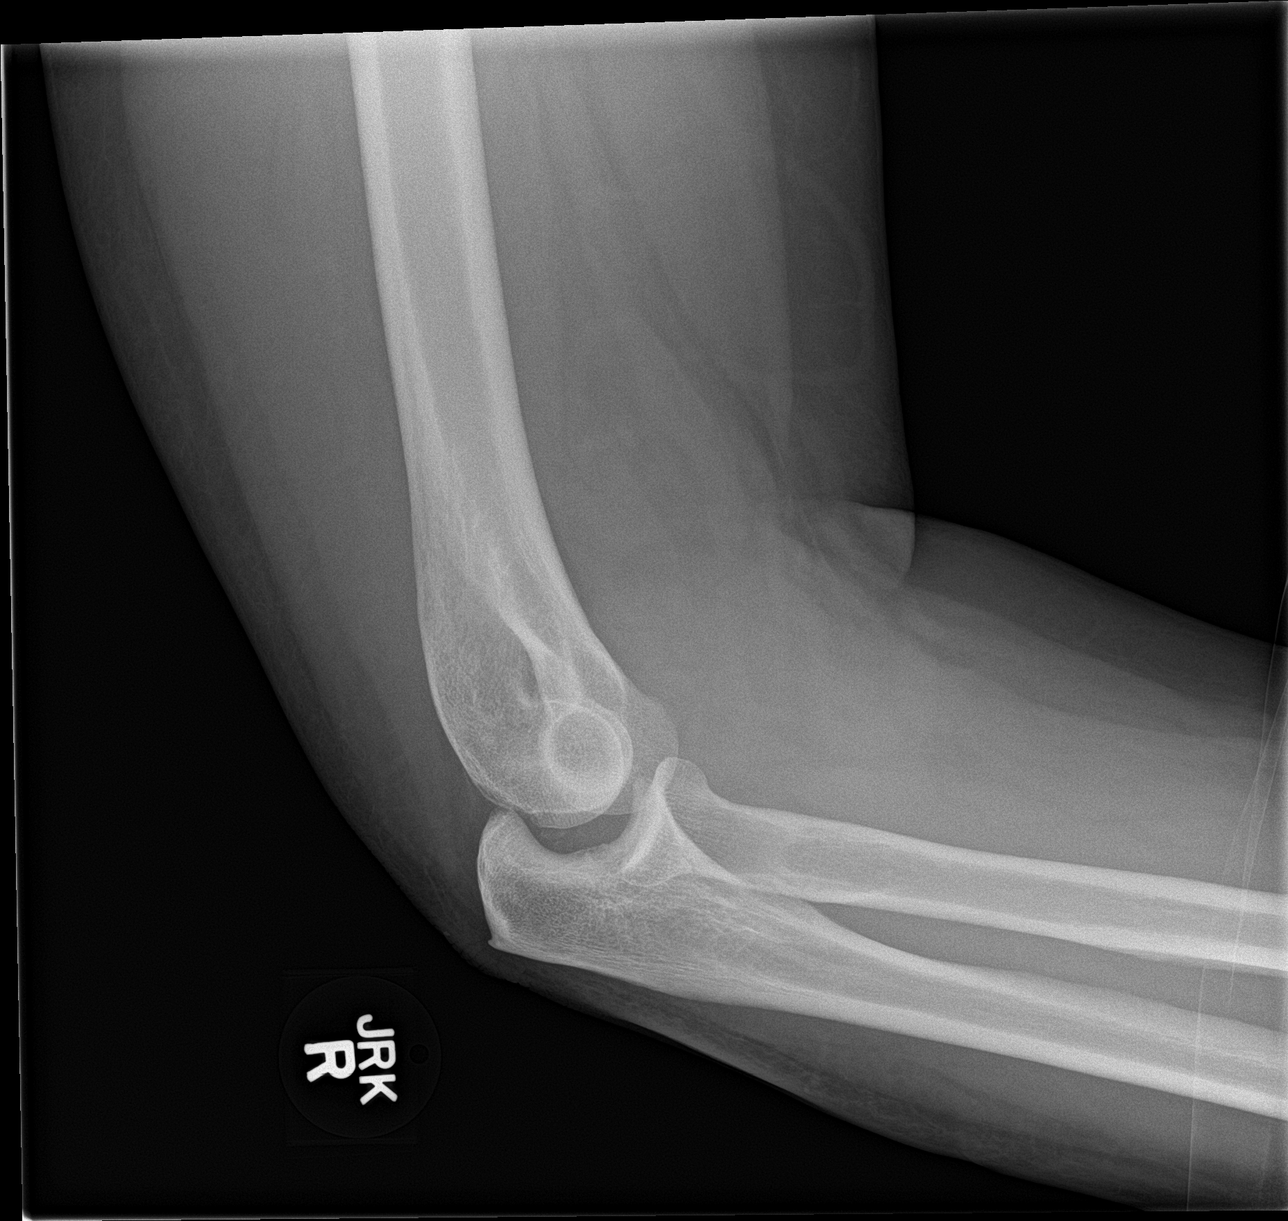

[elbow obl (1 of 2)]
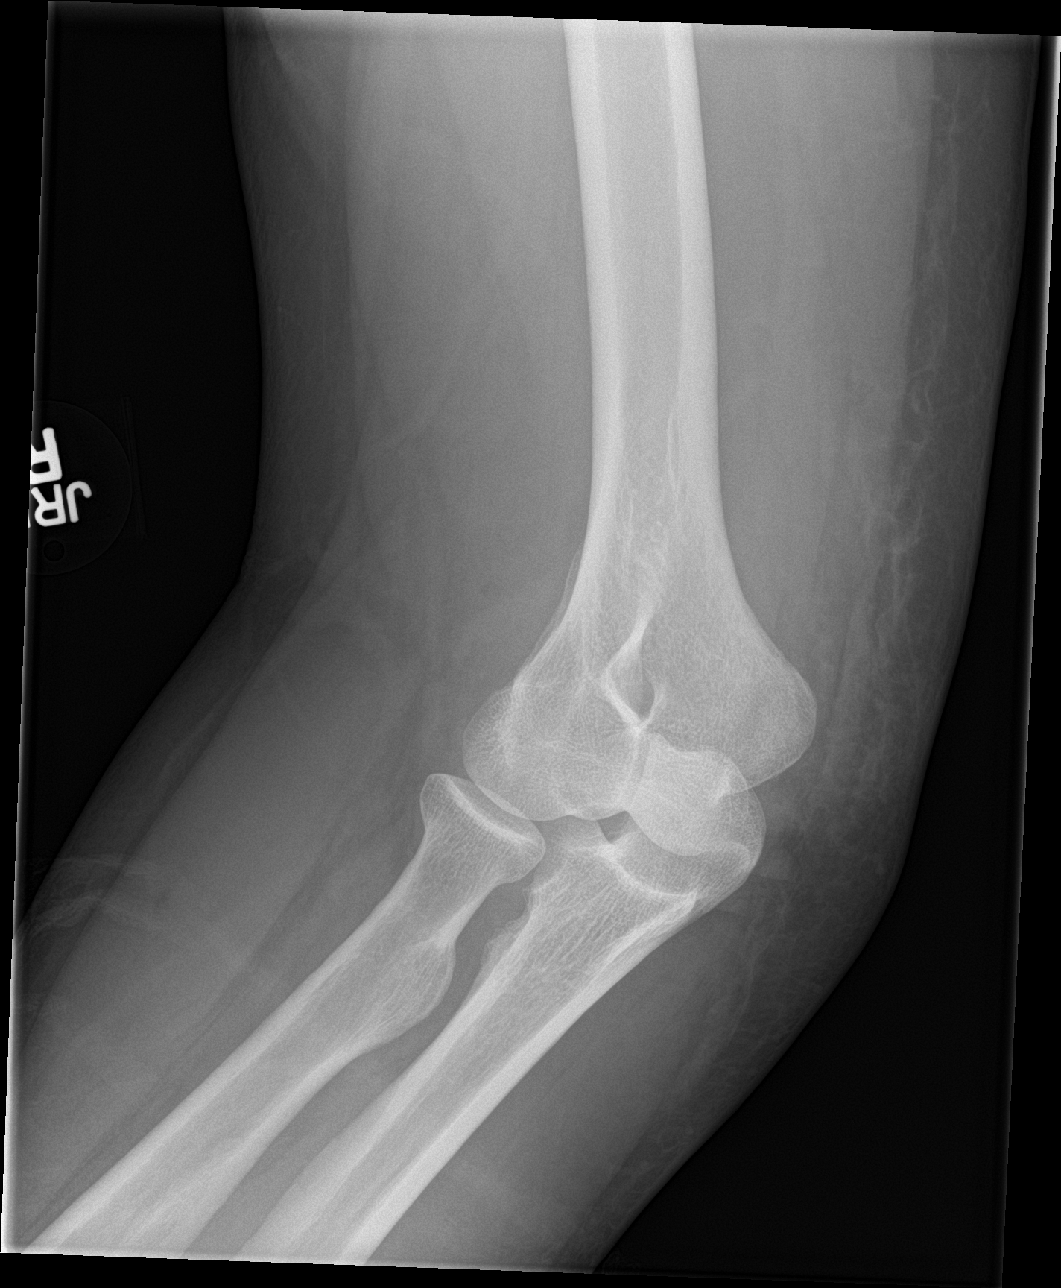

[elbow obl (2 of 2)]
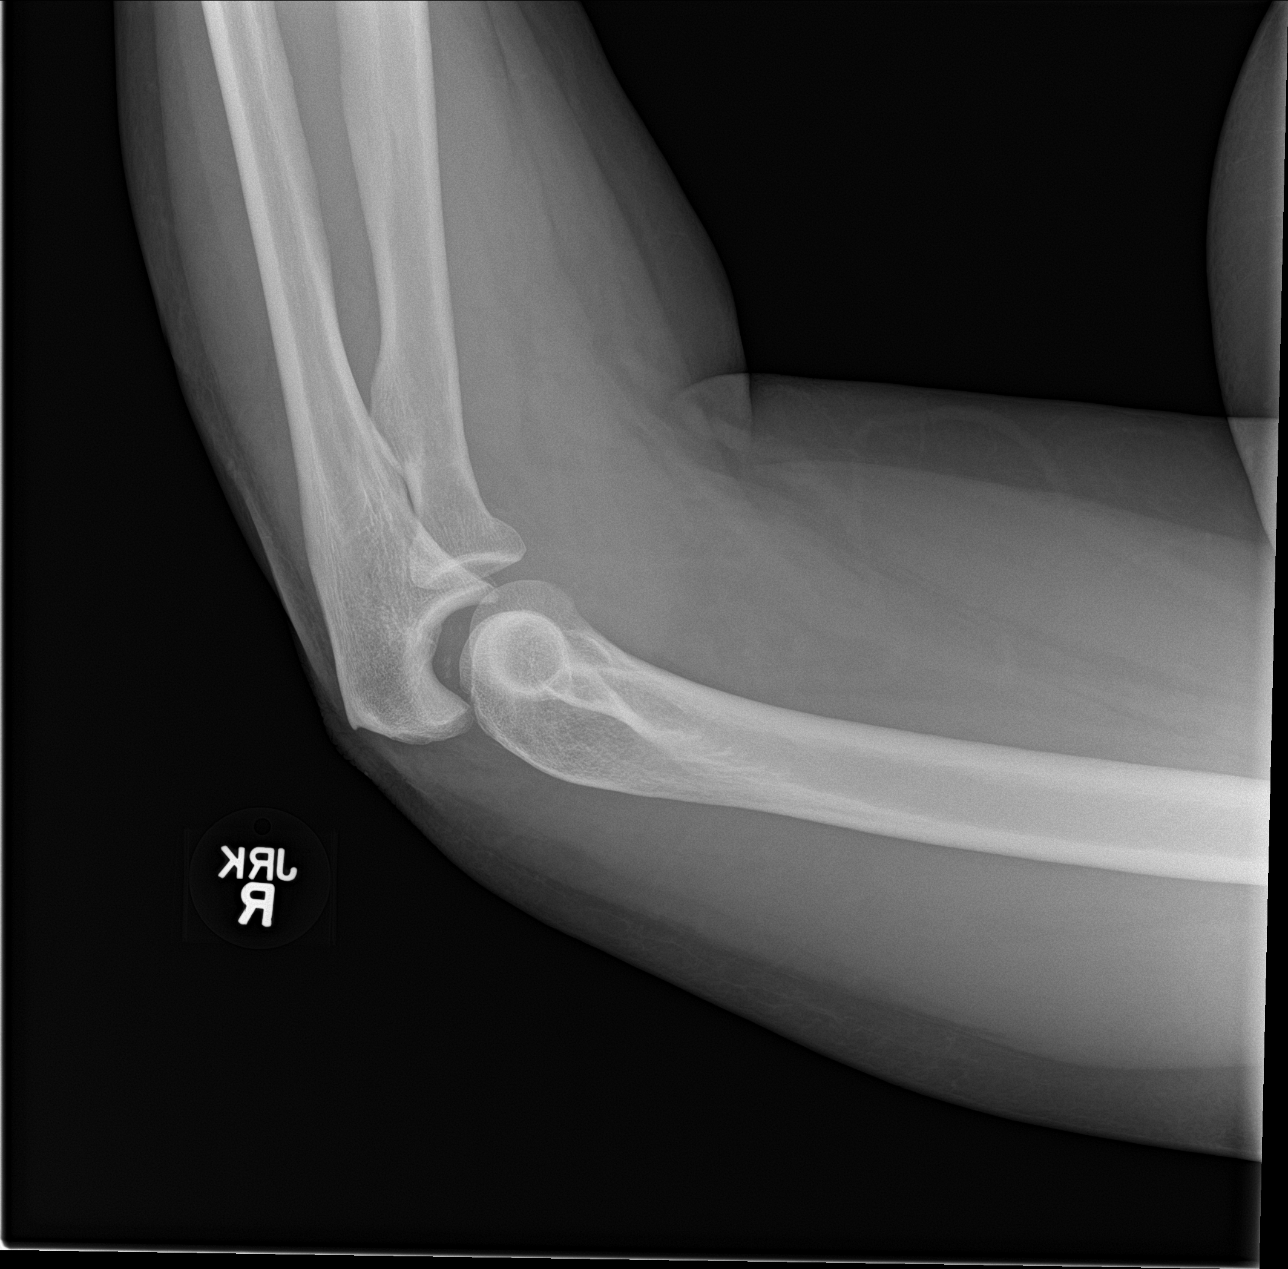

[4 of 4 positions shown; findings below may reference images not displayed]

FINDINGS: There is widening of the elbow joint space, particularly at the
articulation between the distal humerus and olecranon, with
suggestion of underlying tiny osseous fragments. This may reflect
acute subluxation or partial dislocation. Would correlate with the
patient's symptoms, and consider MRI or CT for further evaluation as
deemed clinically appropriate.
IMPRESSION: Widening of the elbow joint space, particularly at the articulation
between the distal humerus and olecranon, with suggestion of
underlying tiny osseous fragments. This may reflect acute
subluxation or partial dislocation. Would correlate with the
patient's symptoms, and consider MRI or CT for further evaluation as
deemed clinically appropriate.
